# Patient Record
Sex: Female | Born: 1965 | Race: White | Hispanic: No | Smoking: Current every day smoker
Health system: Southern US, Community
[De-identification: ages and names within clinical notes are randomized; demographics above are authoritative.]

## PROBLEM LIST (undated history)

## (undated) DIAGNOSIS — R030 Elevated blood-pressure reading, without diagnosis of hypertension: Secondary | ICD-10-CM

## (undated) DIAGNOSIS — Z720 Tobacco use: Secondary | ICD-10-CM

## (undated) HISTORY — DX: Elevated blood-pressure reading, without diagnosis of hypertension: R03.0

## (undated) HISTORY — DX: Tobacco use: Z72.0

---

## 1998-11-16 ENCOUNTER — Other Ambulatory Visit: Admission: RE | Admit: 1998-11-16 | Discharge: 1998-11-16 | Payer: Self-pay | Admitting: Obstetrics and Gynecology

## 2000-05-30 ENCOUNTER — Other Ambulatory Visit: Admission: RE | Admit: 2000-05-30 | Discharge: 2000-05-30 | Payer: Self-pay | Admitting: Obstetrics and Gynecology

## 2001-09-15 ENCOUNTER — Other Ambulatory Visit: Admission: RE | Admit: 2001-09-15 | Discharge: 2001-09-15 | Payer: Self-pay | Admitting: Obstetrics and Gynecology

## 2002-10-16 ENCOUNTER — Other Ambulatory Visit: Admission: RE | Admit: 2002-10-16 | Discharge: 2002-10-16 | Payer: Self-pay | Admitting: Obstetrics and Gynecology

## 2004-02-16 ENCOUNTER — Other Ambulatory Visit: Admission: RE | Admit: 2004-02-16 | Discharge: 2004-02-16 | Payer: Self-pay | Admitting: Obstetrics and Gynecology

## 2009-01-19 ENCOUNTER — Encounter: Admission: RE | Admit: 2009-01-19 | Discharge: 2009-01-19 | Payer: Self-pay | Admitting: Family Medicine

## 2017-02-17 NOTE — Progress Notes (Signed)
Cardiology Office Note   Date:  02/18/2017   ID:  Glenda Prince, DOB 06/02/1966, MRN [id number]  PCP:  No primary care provider on file.  Cardiologist:   Chilton Si, MD   No chief complaint on file.     History of Present Illness: Glenda Prince is a 51 y.o. female who presents for management of elevated blood pressure.  Glenda Prince went for a pre-surgical appointment prior to dental appointment where her BP was noted to 150/90.  She denies ever having elevated blood pressure readings in the past. Typically her blood pressure is around 120/70. In the last 3 months she's lost 10 pounds by following a low carb diet. She is also reduced her smoking from 2 packs per day to 15 cigarettes daily. The only medication change she has made is that she is no longer taking Benadryl to help sleep at night. Glenda Prince feels well. She exercises by doing yoga 3 times per week. When the weather is better she also walks 5 miles daily. She hasn't noted any chest pain, lower extremity edema, orthopnea or PND.  Since she hasn't been walking as often she notes that she is feeling more winded when she walks. She thinks that this is because she is out of shape.    Glenda Prince has several family members with cardiovascular disease.  Her maternal uncle had a heart attack in his 3s.  There is no other history of premature CAD, but both paternal grandparents had heat attacks and her father has heart failure.   Past Medical History:  Diagnosis Date  . Elevated blood pressure reading 02/18/2017  . Tobacco abuse 02/18/2017    No past surgical history on file.   Current Outpatient Prescriptions  Medication Sig Dispense Refill  . cetirizine (ZYRTEC) 10 MG tablet Take 10 mg by mouth daily.     No current facility-administered medications for this visit.     Allergies:   Patient has no allergy information on record.    Social History:  The patient  reports that she has been smoking Cigarettes.  She has a 20.00  pack-year smoking history. She does not have any smokeless tobacco history on file. She reports that she drinks about 0.6 oz of alcohol per week . She reports that she does not use drugs.   Family History:  The patient's family history includes Alzheimer's disease in her maternal grandmother; Heart attack in her maternal uncle, paternal grandfather, and paternal grandmother; Heart disease in her father, maternal grandfather, maternal grandmother, mother, paternal grandfather, and paternal grandmother; Heart failure in her father; Hypertension in her brother, father, and mother.    ROS:  Please see the history of present illness.   Otherwise, review of systems are positive for none.   All other systems are reviewed and negative.    PHYSICAL EXAM: VS:  BP 124/84   Pulse 89   Ht 5\' 8"  (1.727 m)   Wt 69.2 kg (152 lb 9.6 oz)   BMI 23.20 kg/m  , BMI Body mass index is 23.2 kg/m. GENERAL:  Well appearing HEENT:  Pupils equal round and reactive, fundi not visualized, oral mucosa unremarkable NECK:  No jugular venous distention, waveform within normal limits, carotid upstroke brisk and symmetric, no bruits, no thyromegaly LYMPHATICS:  No cervical adenopathy LUNGS:  Clear to auscultation bilaterally HEART:  RRR.  PMI not displaced or sustained,S1 and S2 within normal limits, no S3, no S4, no clicks, no rubs, no murmurs ABD:  Flat, positive bowel sounds normal in frequency in pitch, no bruits, no rebound, no guarding, no midline pulsatile mass, no hepatomegaly, no splenomegaly EXT:  2 plus pulses throughout, no edema, no cyanosis no clubbing SKIN:  No rashes no nodules NEURO:  Cranial nerves II through XII grossly intact, motor grossly intact throughout PSYCH:  Cognitively intact, oriented to person place and time    EKG:  EKG is ordered today. The ekg ordered today demonstrates sinus rhythm. Rate 89 bpm. Cannot rule out prior septal infarct.   Recent Labs: No results found for requested labs  within last 8760 hours.    Lipid Panel No results found for: CHOL, TRIG, HDL, CHOLHDL, VLDL, LDLCALC, LDLDIRECT    Wt Readings from Last 3 Encounters:  02/18/17 69.2 kg (152 lb 9.6 oz)      ASSESSMENT AND PLAN:  # Stage I Hypertension: Diastolic BP remains elevated here and at home.  I suspect that she also had White Coat Hypertension at her anesthesia appointment.  Recommended smoking cessation and increased cardio.  No medication for now.  # CV Disease risk:  We will check lipids and a CMP.  She will also get a coronary calcium score to better risk stratify.  # Tobacco abuse: Glenda Prince was congratulated on Cutting back her smoking. She was encouraged to continue cutting back. She is interested in trying Chantix but does not think that she is ready at this time.   Current medicines are reviewed at length with the patient today.  The patient does not have concerns regarding medicines.  The following changes have been made:  no change  Labs/ tests ordered today include:   Orders Placed This Encounter  Procedures  . CT CARDIAC SCORING  . Lipid panel  . Comprehensive metabolic panel  . EKG 12-Lead     Disposition:   FU with Jorden Mahl C. Duke Salviaandolph, MD, [facility] as needed.    This note was written with the assistance of speech recognition software.  Please excuse any transcriptional errors.  Signed, Saif Peter C. Duke Salviaandolph, MD, [facility]  02/18/2017 1:09 PM    [facility]

## 2017-02-18 ENCOUNTER — Encounter: Payer: Self-pay | Admitting: Cardiovascular Disease

## 2017-02-18 ENCOUNTER — Encounter (INDEPENDENT_AMBULATORY_CARE_PROVIDER_SITE_OTHER): Payer: Self-pay

## 2017-02-18 ENCOUNTER — Ambulatory Visit (INDEPENDENT_AMBULATORY_CARE_PROVIDER_SITE_OTHER): Payer: BLUE CROSS/BLUE SHIELD | Admitting: Cardiovascular Disease

## 2017-02-18 VITALS — BP 124/84 | HR 89 | Ht 68.0 in | Wt 152.6 lb

## 2017-02-18 DIAGNOSIS — R030 Elevated blood-pressure reading, without diagnosis of hypertension: Secondary | ICD-10-CM

## 2017-02-18 DIAGNOSIS — Z720 Tobacco use: Secondary | ICD-10-CM | POA: Diagnosis not present

## 2017-02-18 DIAGNOSIS — Z8249 Family history of ischemic heart disease and other diseases of the circulatory system: Secondary | ICD-10-CM | POA: Diagnosis not present

## 2017-02-18 DIAGNOSIS — Z13220 Encounter for screening for lipoid disorders: Secondary | ICD-10-CM | POA: Diagnosis not present

## 2017-02-18 HISTORY — DX: Tobacco use: Z72.0

## 2017-02-18 HISTORY — DX: Elevated blood-pressure reading, without diagnosis of hypertension: R03.0

## 2017-02-18 LAB — COMPREHENSIVE METABOLIC PANEL
ALT: 12 U/L (ref 6–29)
AST: 18 U/L (ref 10–35)
Albumin: 4.4 g/dL (ref 3.6–5.1)
Alkaline Phosphatase: 39 U/L (ref 33–130)
BILIRUBIN TOTAL: 0.3 mg/dL (ref 0.2–1.2)
BUN: 17 mg/dL (ref 7–25)
CALCIUM: 9.7 mg/dL (ref 8.6–10.4)
CHLORIDE: 106 mmol/L (ref 98–110)
CO2: 27 mmol/L (ref 20–31)
Creat: 0.82 mg/dL (ref 0.50–1.05)
GLUCOSE: 98 mg/dL (ref 65–99)
Potassium: 4.2 mmol/L (ref 3.5–5.3)
Sodium: 141 mmol/L (ref 135–146)
Total Protein: 6.7 g/dL (ref 6.1–8.1)

## 2017-02-18 LAB — LIPID PANEL
CHOL/HDL RATIO: 2 ratio (ref <5.0)
CHOLESTEROL: 161 mg/dL (ref <200)
HDL: 82 mg/dL (ref >50)
LDL Cholesterol: 66 mg/dL (ref <100)
Triglycerides: 64 mg/dL (ref <150)
VLDL: 13 mg/dL (ref <30)

## 2017-02-18 NOTE — Patient Instructions (Signed)
Medication Instructions:  Your physician recommends that you continue on your current medications as directed. Please refer to the Current Medication list given to you today.  Labwork: LP/CMET AT [facility] ON THE FIRST FLOOR  Testing/Procedures: CALCIUM SCORE   Follow-Up: AS NEEDED

## 2017-02-20 ENCOUNTER — Ambulatory Visit (INDEPENDENT_AMBULATORY_CARE_PROVIDER_SITE_OTHER)
Admission: RE | Admit: 2017-02-20 | Discharge: 2017-02-20 | Disposition: A | Discharge status: Home / Self Care | Payer: Self-pay | Source: Ambulatory Visit | Attending: Cardiovascular Disease | Admitting: Cardiovascular Disease

## 2017-02-20 DIAGNOSIS — Z8249 Family history of ischemic heart disease and other diseases of the circulatory system: Secondary | ICD-10-CM

## 2017-12-19 IMAGING — CT CT HEART SCORING
2 series · 16 of 20 positions shown, 18 images · non-contrast
Comparison: None.

CLINICAL DATA: Risk stratification

EXAM:
Coronary Calcium Score
TECHNIQUE: The patient was scanned on a Siemens Somatom 64 slice scanner. Axial
non-contrast 3 mm slices were carried out through the heart. The
data set was analyzed on a dedicated work station and scored using
the Agatson method.

[Series 2: casc 3.0 i36f 2 bestdiast 70 % · axial · 0.32mm/px · z∈[-267,-153]mm · 8 of 50 slices shown, 10 images]
[im 6/50  vessel]
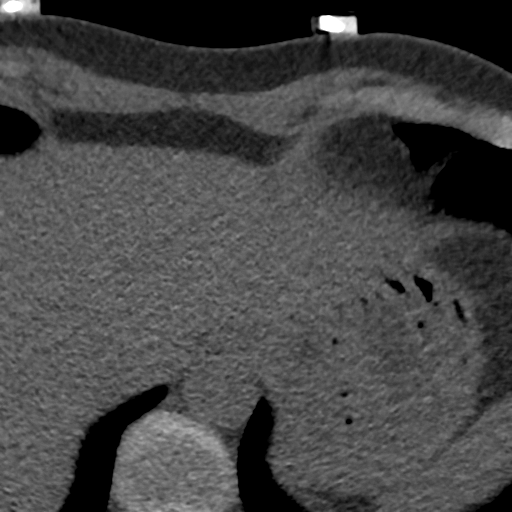
[im 6/50  lung]
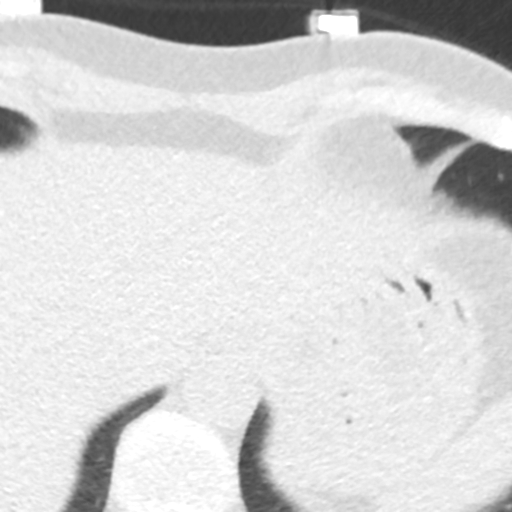
[im 11/50  vessel]
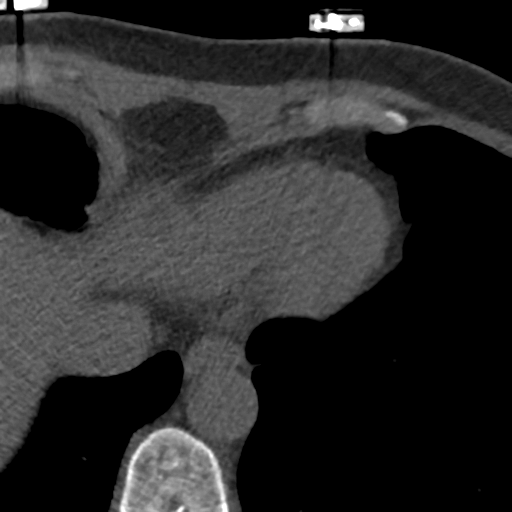
[im 17/50  vessel]
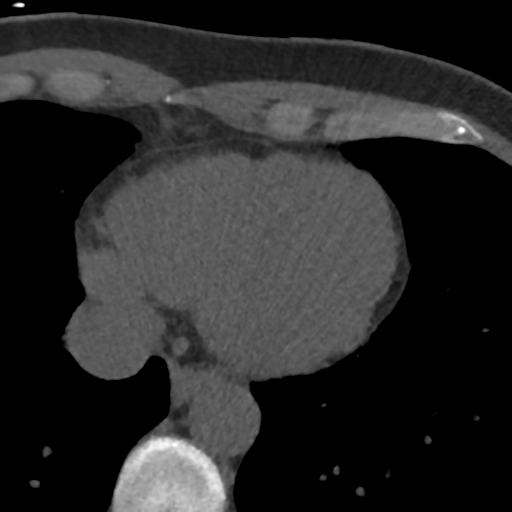
[im 22/50  vessel]
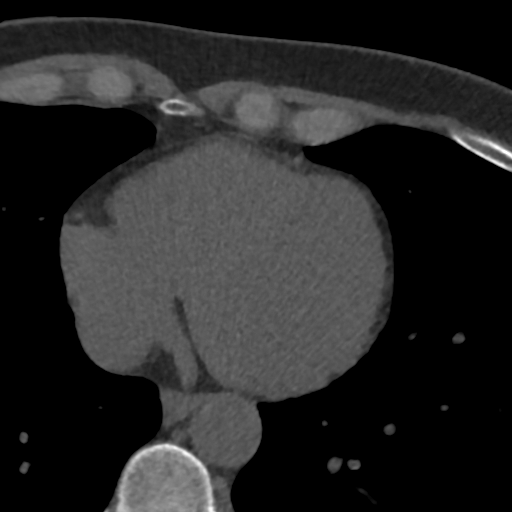
[im 28/50  vessel]
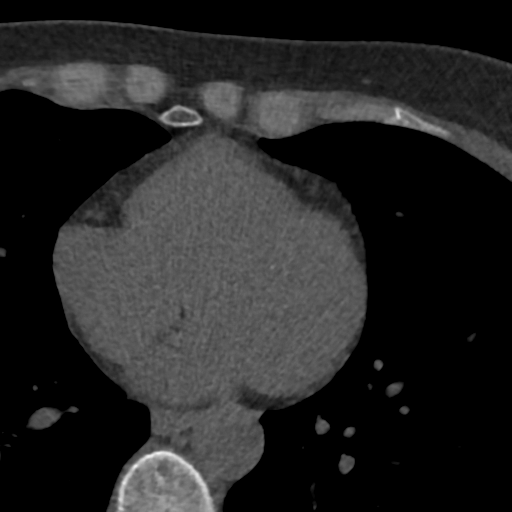
[im 28/50  lung]
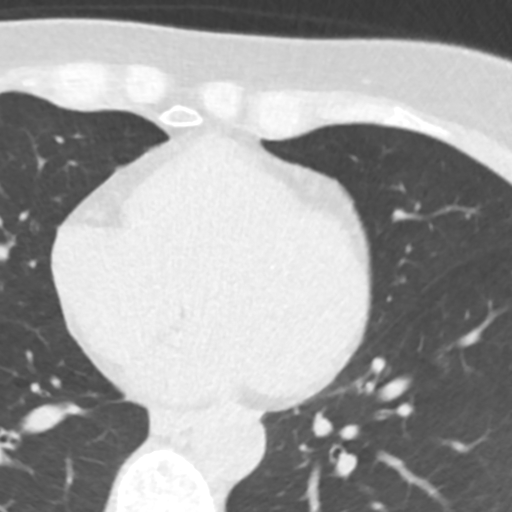
[im 33/50  vessel]
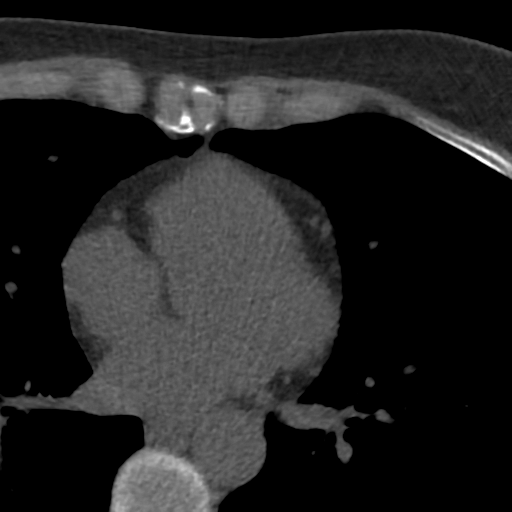
[im 39/50  vessel]
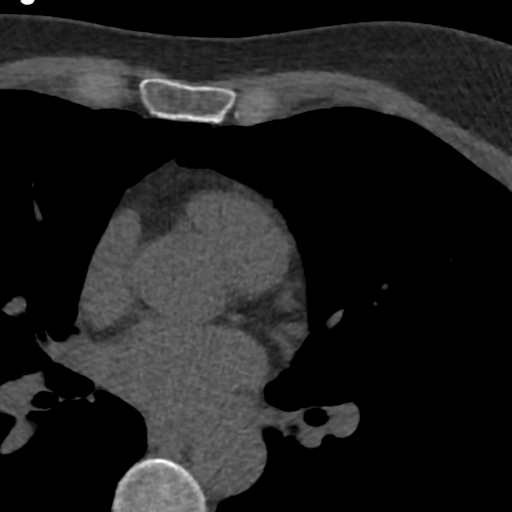
[im 44/50  vessel]
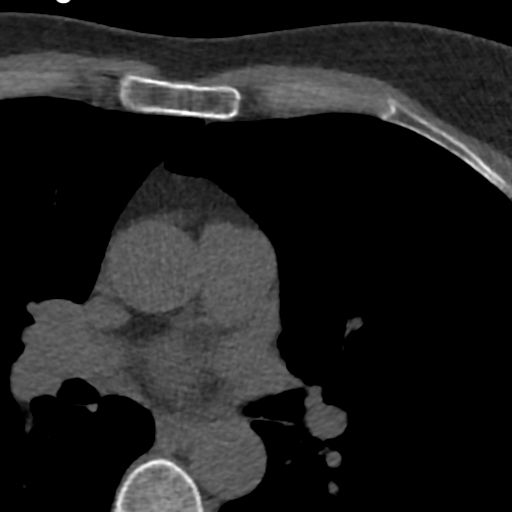

[Series 3: lung st 71 % · axial · 0.67mm/px · z∈[-266,-152]mm · 8 of 50 slices shown]
[im 6/50  lung]
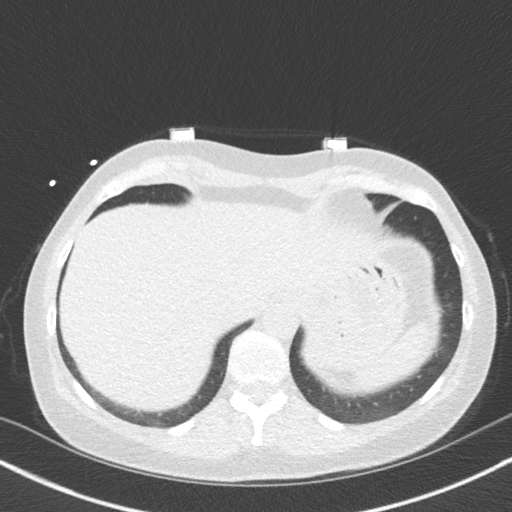
[im 11/50  lung]
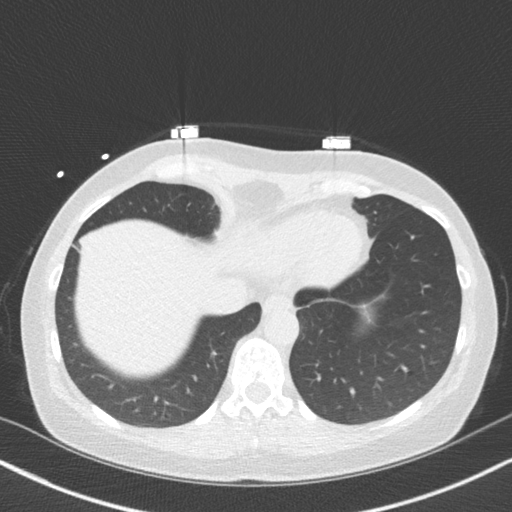
[im 17/50  lung]
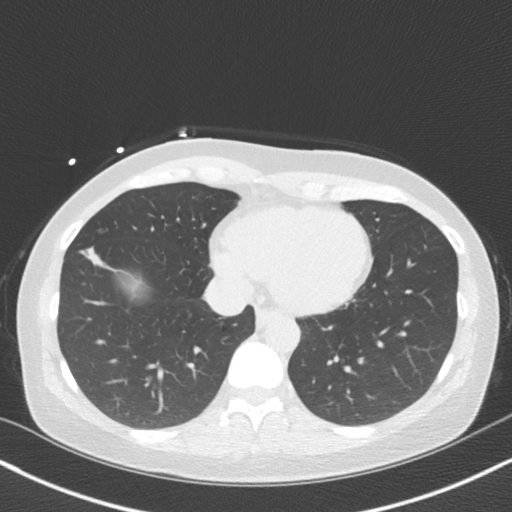
[im 22/50  lung]
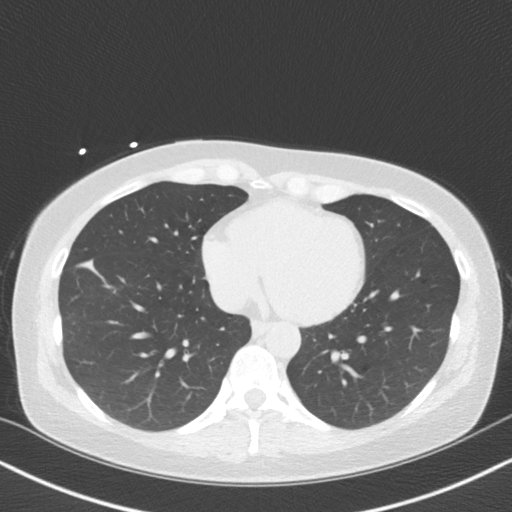
[im 28/50  lung]
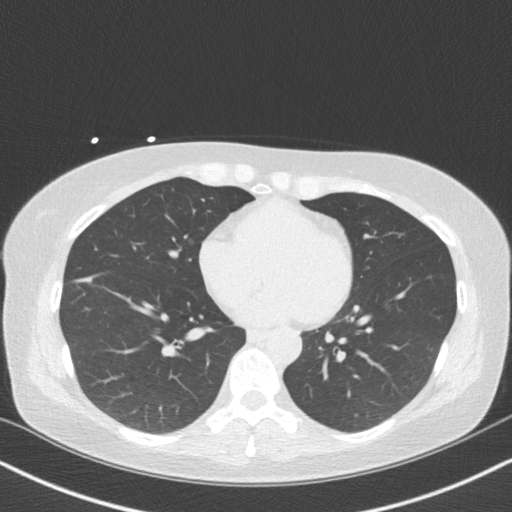
[im 33/50  lung]
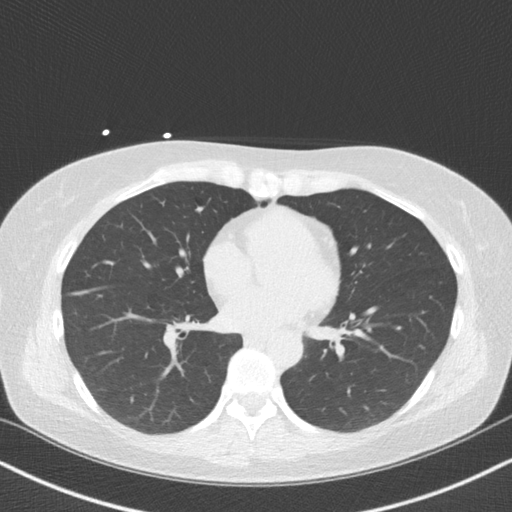
[im 39/50  lung]
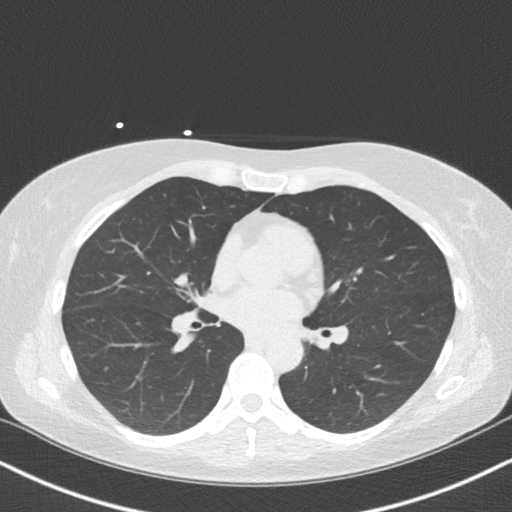
[im 44/50  lung]
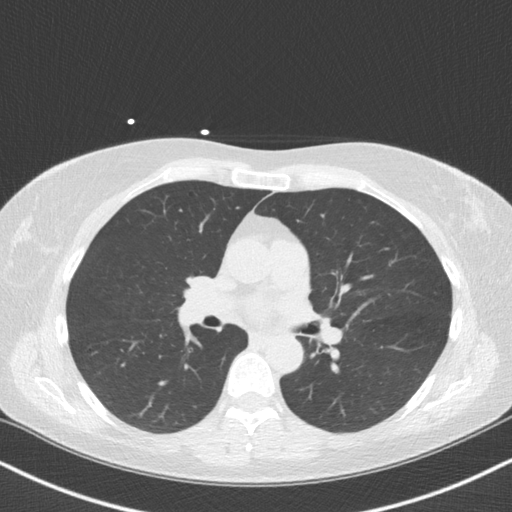

[16 of 20 positions shown; findings below may reference images not displayed]

FINDINGS: Non-cardiac: See separate report from [REDACTED].

Ascending Aorta:  Normal 3.2 cm

Pericardium: Normal

Coronary arteries:  No calcium detected
IMPRESSION: Coronary calcium score of 0.

Manuel Celestino Langa

EXAM:
OVER-READ INTERPRETATION  CT CHEST

The following report is an over-read performed by radiologist Dr.
over-read does not include interpretation of cardiac or coronary
anatomy or pathology. The coronary calcium score interpretation by
the cardiologist is attached.
FINDINGS: Linear scarring in the anterior aspect of the right lower lobe.
Within the visualized portions of the thorax there are no suspicious
appearing pulmonary nodules or masses, there is no acute
consolidative airspace disease, no pleural effusions, no
pneumothorax and no lymphadenopathy. Visualized portions of the
upper abdomen are unremarkable. There are no aggressive appearing
lytic or blastic lesions noted in the visualized portions of the
skeleton.
IMPRESSION: 1. No significant incidental extracardiac findings are noted.
